# Patient Record
Sex: Female | Born: 2005 | Hispanic: No | Marital: Single | Smoking: Never smoker
Health system: Southern US, Community
[De-identification: ages and names within clinical notes are randomized; demographics above are authoritative.]

## PROBLEM LIST (undated history)

## (undated) DIAGNOSIS — D573 Sickle-cell trait: Secondary | ICD-10-CM

## (undated) DIAGNOSIS — J353 Hypertrophy of tonsils with hypertrophy of adenoids: Secondary | ICD-10-CM

## (undated) DIAGNOSIS — R05 Cough: Secondary | ICD-10-CM

## (undated) DIAGNOSIS — J45909 Unspecified asthma, uncomplicated: Secondary | ICD-10-CM

## (undated) DIAGNOSIS — F909 Attention-deficit hyperactivity disorder, unspecified type: Secondary | ICD-10-CM

## (undated) DIAGNOSIS — R0981 Nasal congestion: Secondary | ICD-10-CM

## (undated) DIAGNOSIS — S61019A Laceration without foreign body of unspecified thumb without damage to nail, initial encounter: Secondary | ICD-10-CM

## (undated) HISTORY — PX: TONSILLECTOMY: SUR1361

## (undated) HISTORY — DX: Attention-deficit hyperactivity disorder, unspecified type: F90.9

---
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
## 2025-01-02 ENCOUNTER — Encounter

## 2025-01-16 ENCOUNTER — Encounter

## 2025-01-30 ENCOUNTER — Encounter

## 2025-02-13 ENCOUNTER — Encounter

## 2025-02-20 ENCOUNTER — Encounter

## 2025-02-27 ENCOUNTER — Encounter

## 2025-03-06 ENCOUNTER — Encounter

## 2025-03-13 ENCOUNTER — Encounter
